# Patient Record
Sex: Male | Born: 1985 | Hispanic: No | Marital: Single | Smoking: Current every day smoker
Health system: Southern US, Community
[De-identification: ages and names within clinical notes are randomized; demographics above are authoritative.]

---

## 2010-11-04 ENCOUNTER — Inpatient Hospital Stay (INDEPENDENT_AMBULATORY_CARE_PROVIDER_SITE_OTHER)
Admission: RE | Admit: 2010-11-04 | Discharge: 2010-11-04 | Disposition: A | Discharge status: Home / Self Care | Payer: Self-pay | Source: Ambulatory Visit | Attending: Family Medicine | Admitting: Family Medicine

## 2010-11-04 ENCOUNTER — Ambulatory Visit (INDEPENDENT_AMBULATORY_CARE_PROVIDER_SITE_OTHER): Payer: Self-pay

## 2010-11-04 DIAGNOSIS — S20219A Contusion of unspecified front wall of thorax, initial encounter: Secondary | ICD-10-CM

## 2011-09-11 IMAGING — CR DG RIBS W/ CHEST 3+V*R*
4 series · 4 of 4 positions shown · non-contrast
Comparison: None.

CLINICAL DATA: Trauma with right rib pain.

RIGHT RIBS AND CHEST - 3+ VIEW

[view not recorded (1 of 4)]
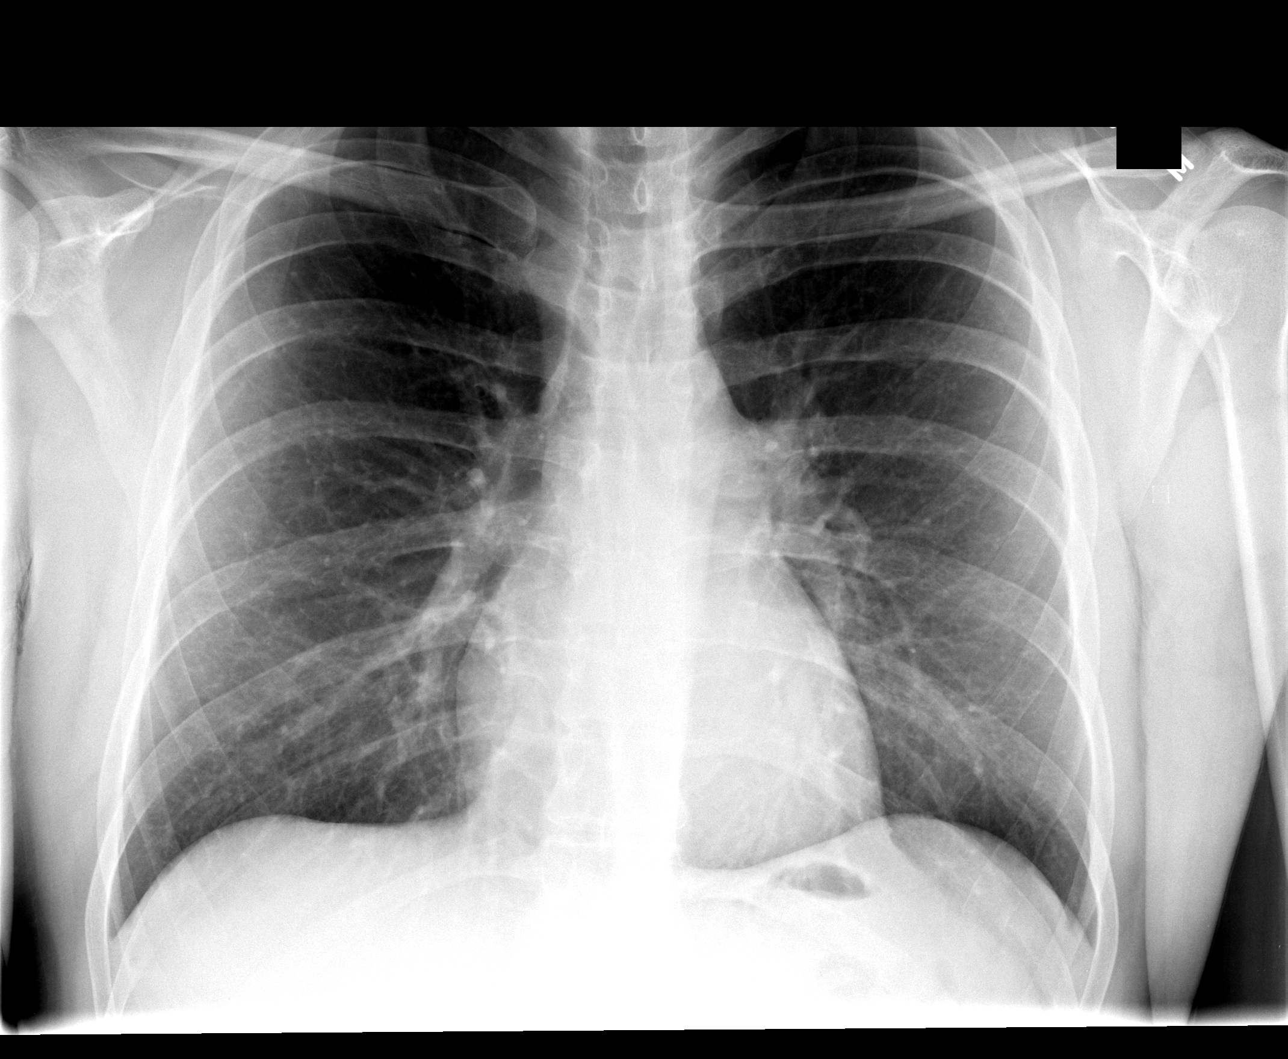

[view not recorded (2 of 4)]
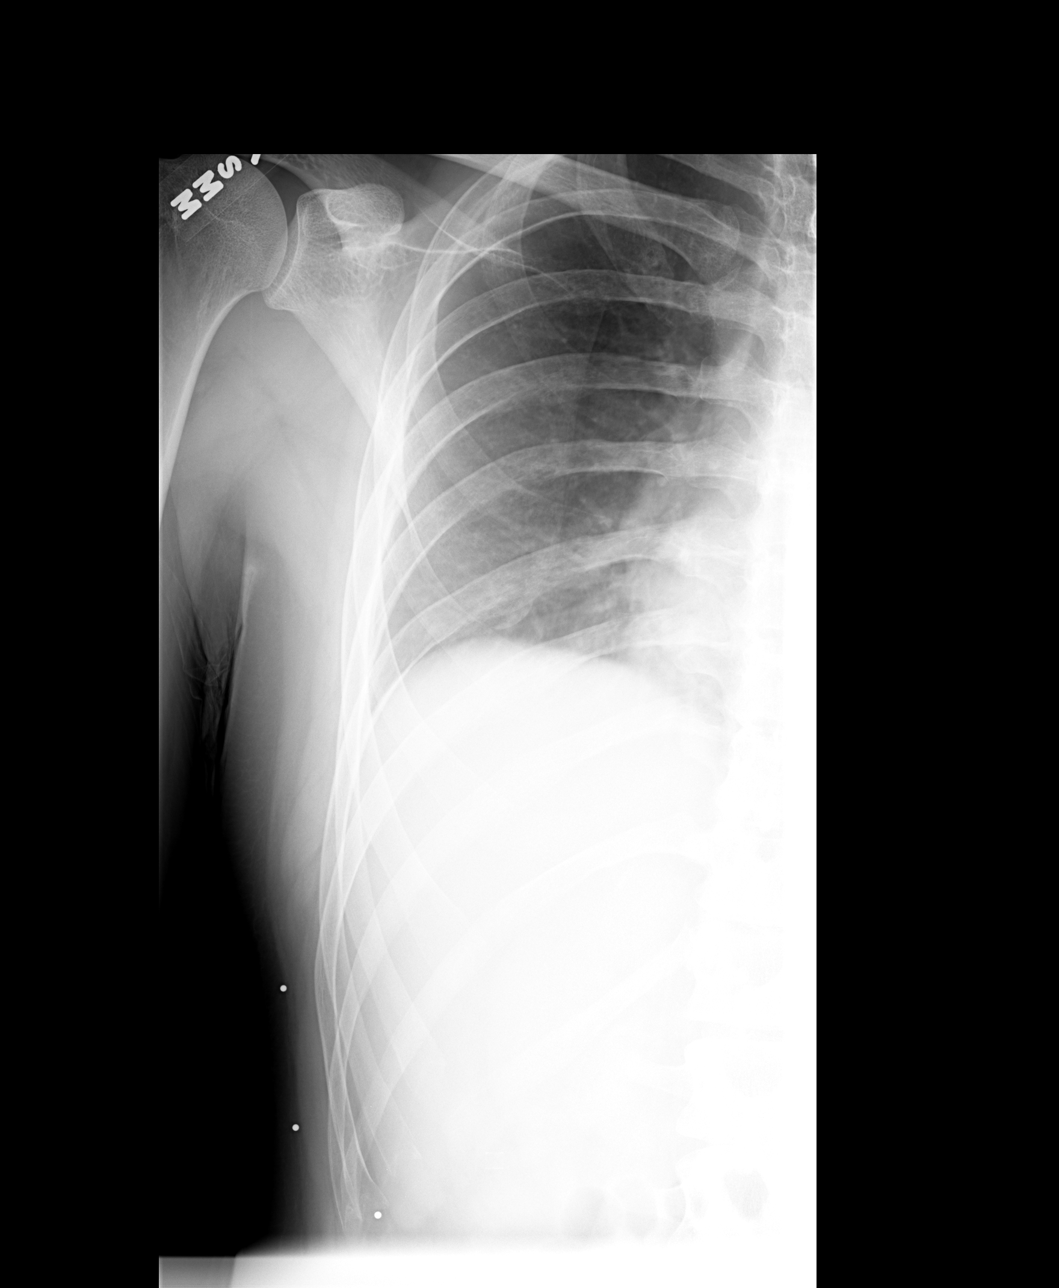

[view not recorded (3 of 4)]
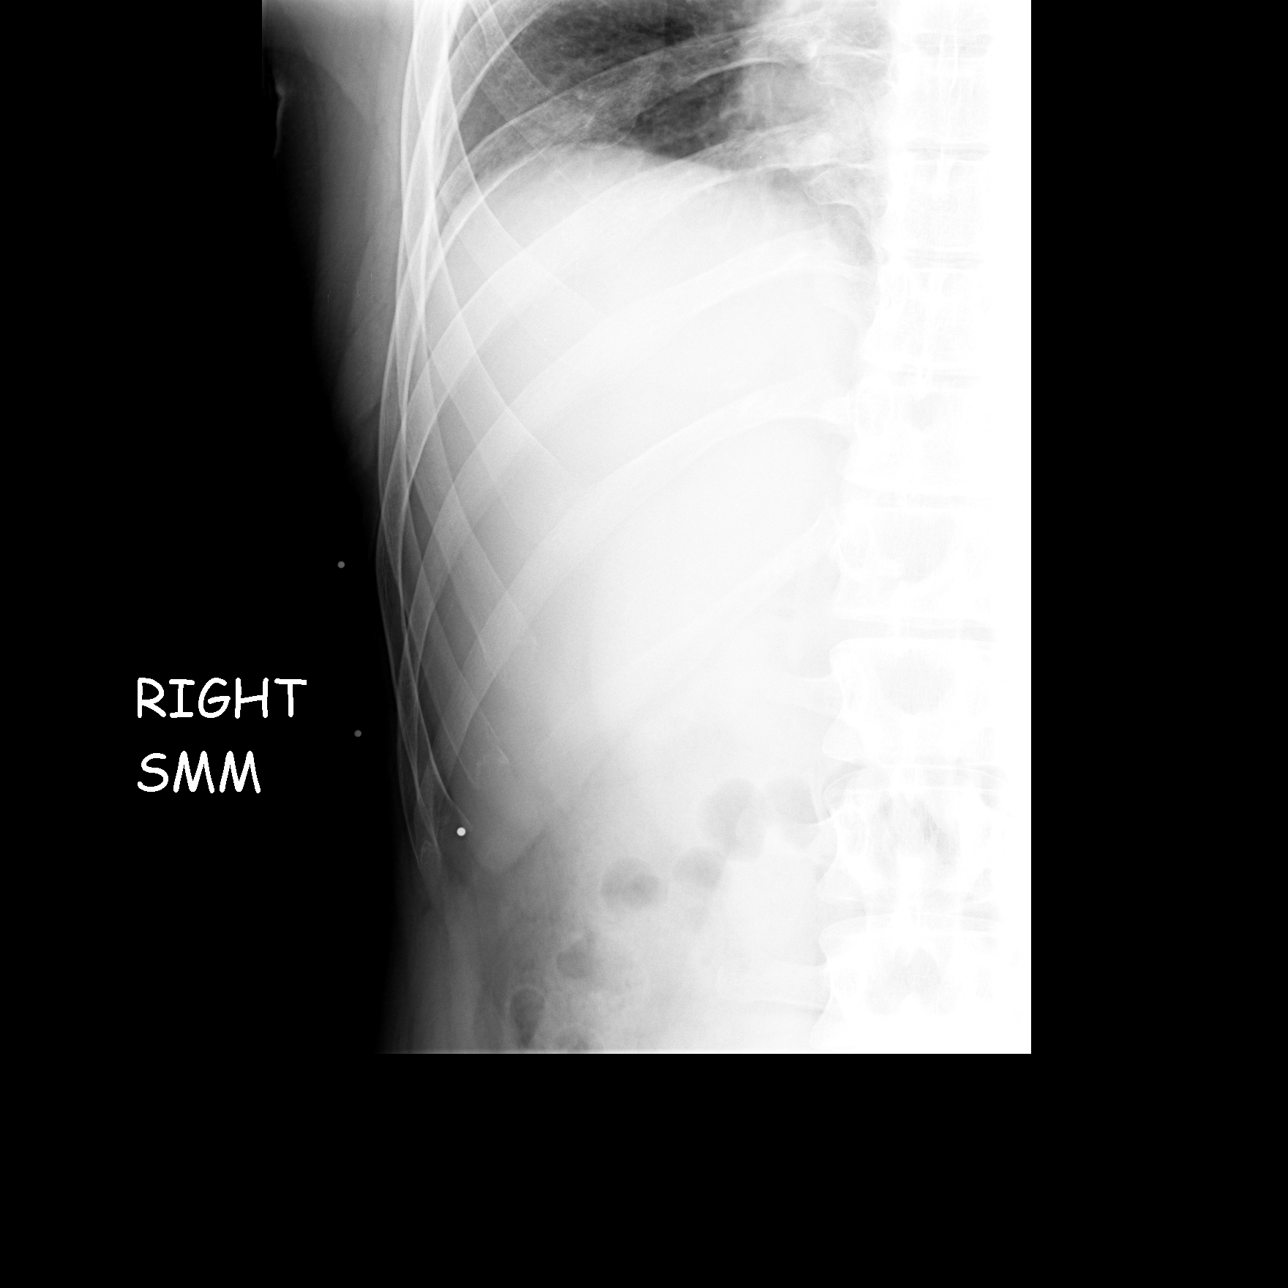

[view not recorded (4 of 4)]
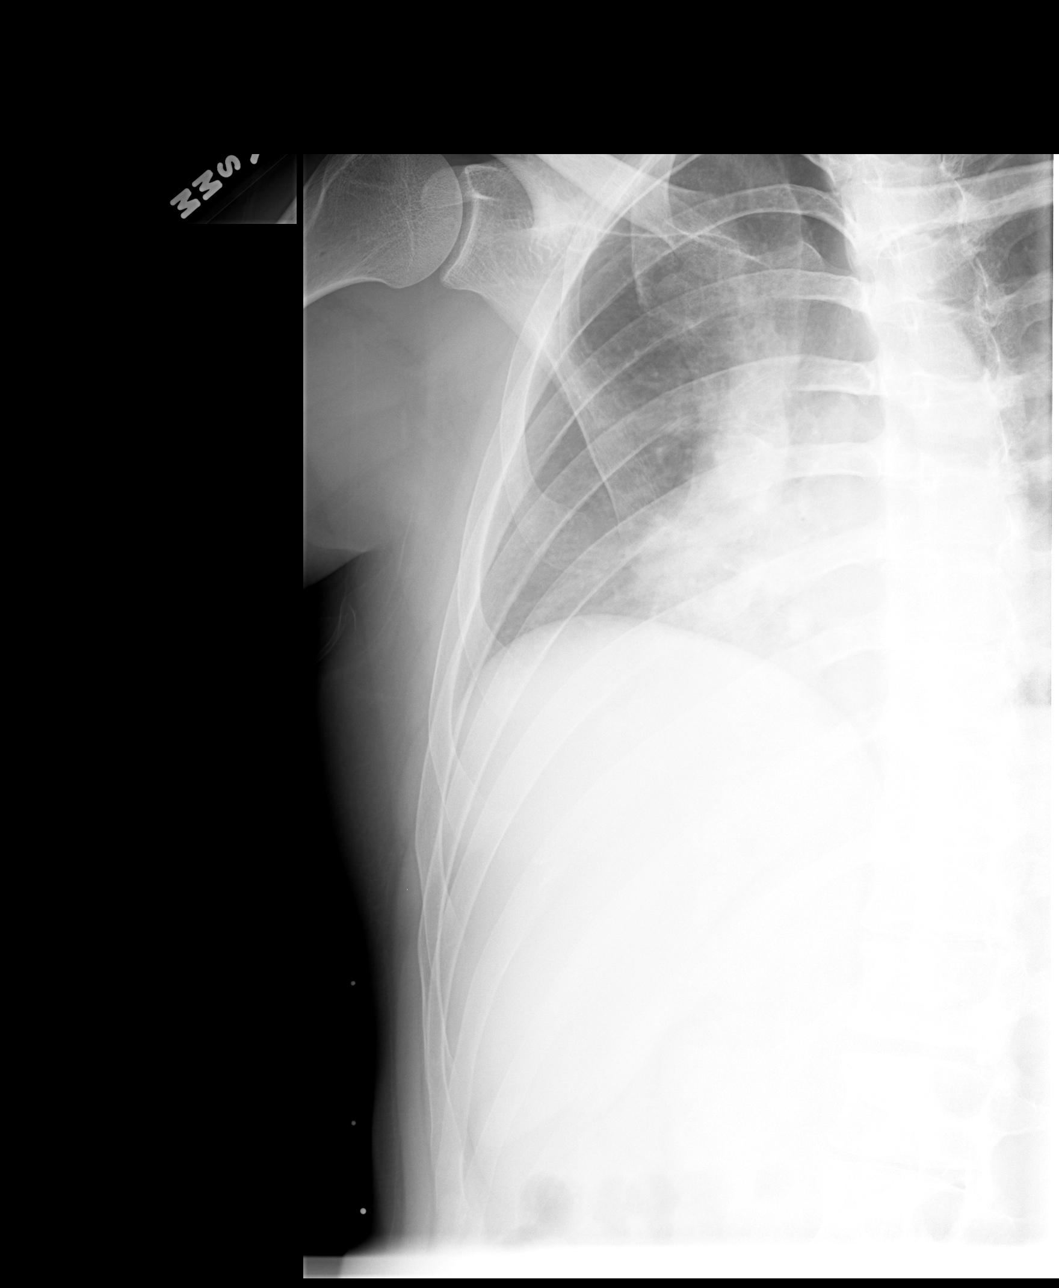

[4 of 4 positions shown; findings below may reference images not displayed]

FINDINGS: No right-sided rib fracture noted.  No pneumothorax.
Lungs clear.  Heart size within normal limits.
IMPRESSION: No right-sided rib fracture or pneumothorax detected.

## 2016-11-10 ENCOUNTER — Emergency Department (HOSPITAL_COMMUNITY)
Admission: EM | Admit: 2016-11-10 | Discharge: 2016-11-10 | Disposition: A | Discharge status: Home / Self Care | Payer: Managed Care, Other (non HMO) | Attending: Emergency Medicine | Admitting: Emergency Medicine

## 2016-11-10 ENCOUNTER — Emergency Department (HOSPITAL_COMMUNITY): Payer: Managed Care, Other (non HMO)

## 2016-11-10 ENCOUNTER — Encounter (HOSPITAL_COMMUNITY): Payer: Self-pay

## 2016-11-10 DIAGNOSIS — R0789 Other chest pain: Secondary | ICD-10-CM

## 2016-11-10 DIAGNOSIS — F17200 Nicotine dependence, unspecified, uncomplicated: Secondary | ICD-10-CM | POA: Diagnosis not present

## 2016-11-10 DIAGNOSIS — R079 Chest pain, unspecified: Secondary | ICD-10-CM | POA: Diagnosis present

## 2016-11-10 LAB — URINALYSIS, ROUTINE W REFLEX MICROSCOPIC
Bilirubin Urine: NEGATIVE
GLUCOSE, UA: NEGATIVE mg/dL
Hgb urine dipstick: NEGATIVE
Ketones, ur: 20 mg/dL — AB
LEUKOCYTES UA: NEGATIVE
Nitrite: NEGATIVE
PH: 6 (ref 5.0–8.0)
Protein, ur: NEGATIVE mg/dL
SPECIFIC GRAVITY, URINE: 1.005 (ref 1.005–1.030)

## 2016-11-10 LAB — BASIC METABOLIC PANEL
Anion gap: 11 (ref 5–15)
BUN: 12 mg/dL (ref 6–20)
CHLORIDE: 100 mmol/L — AB (ref 101–111)
CO2: 23 mmol/L (ref 22–32)
Calcium: 9.6 mg/dL (ref 8.9–10.3)
Creatinine, Ser: 0.89 mg/dL (ref 0.61–1.24)
GFR calc non Af Amer: >60 mL/min (ref >60)
Glucose, Bld: 84 mg/dL (ref 65–99)
POTASSIUM: 4.2 mmol/L (ref 3.5–5.1)
SODIUM: 134 mmol/L — AB (ref 135–145)

## 2016-11-10 LAB — I-STAT TROPONIN, ED: Troponin i, poc: 0 ng/mL (ref 0.00–0.08)

## 2016-11-10 LAB — CBC
HEMATOCRIT: 44.3 % (ref 39.0–52.0)
Hemoglobin: 14.9 g/dL (ref 13.0–17.0)
MCH: 31.3 pg (ref 26.0–34.0)
MCHC: 33.6 g/dL (ref 30.0–36.0)
MCV: 93.1 fL (ref 78.0–100.0)
Platelets: 260 10*3/uL (ref 150–400)
RBC: 4.76 MIL/uL (ref 4.22–5.81)
RDW: 12.5 % (ref 11.5–15.5)
WBC: 8.1 10*3/uL (ref 4.0–10.5)

## 2016-11-10 LAB — CBG MONITORING, ED: Glucose-Capillary: 75 mg/dL (ref 65–99)

## 2016-11-10 LAB — D-DIMER, QUANTITATIVE (NOT AT ARMC)

## 2016-11-10 NOTE — ED Triage Notes (Signed)
Pt states he has had lightheadedness and feeling not himself for several days. [facility] for tachycardia and further workup. Pt alert and oriented, skin warm and dry.

## 2016-11-10 NOTE — ED Provider Notes (Addendum)
[facility]-EMERGENCY DEPT Provider Note   CSN: [id number] Arrival date & time: 11/10/16  1452     History   Chief Complaint Chief Complaint  Patient presents with  . Fatigue    HPI Walter Figueroa is a 31 y.o. male.  Patient is a 31 year old male with a history of ADHD who presents with left-sided chest pain. He states it's been going on for about a week. It's to the anterior portion of his left shoulder and upper chest wall as well as the posterior shoulder area. It's sometimes radiates across his chest. There is no associated back pain other than the pain behind his left shoulder. He denies any radiation down his left arm. He denies any weakness or numbness in the hand. He states it's been fairly constant for about the last week but will get worse at times. He states that certain movements exacerbate the pain. When he was doing some lifting it seemed to get worse. It's nonexertional. He denies any shortness of breath associated with the pain. No nausea or vomiting. No prior history of heart disease. No known history of hyperlipidemia although he doesn't feel like he's had his cholesterol checked in the past. He is a smoker but he states he only smokes about 3-4 cigarettes a day. He states his dad did have bypass surgery in his early to mid 34s. He does state that he traveled to [place] about a month ago. He denies any pain or swelling in his lower extremities. He denies any pleuritic-type chest pain. He hasn't taken any medication for the pain. He was seen in urgent care and sent here for further evaluation.      History reviewed. No pertinent past medical history.  There are no active problems to display for this patient.   History reviewed. No pertinent surgical history.     Home Medications    Prior to Admission medications   Medication Sig Start Date End Date Taking? Authorizing Provider  amphetamine-dextroamphetamine (ADDERALL) 30 MG tablet Take 30 mg by mouth 2 (two)  times daily.   Yes [provider]  fluticasone (FLONASE) 50 MCG/ACT nasal spray Place 1 spray into both nostrils daily.   Yes [provider]  ibuprofen (ADVIL,MOTRIN) 200 MG tablet Take 200 mg by mouth every 6 (six) hours as needed for mild pain.   Yes [provider]  magnesium 30 MG tablet Take 30 mg by mouth 2 (two) times daily.   Yes [provider]  traZODone (DESYREL) 50 MG tablet Take 50 mg by mouth at bedtime as needed for sleep.   Yes [provider]    Family History History reviewed. No pertinent family history.  Social History Social History  Substance Use Topics  . Smoking status: Current Every Day Smoker    Packs/day: 0.25  . Smokeless tobacco: Never Used  . Alcohol use Yes     Comment: 5 days/week     Allergies   Patient has no known allergies.   Review of Systems Review of Systems  Constitutional: Negative for chills, diaphoresis, fatigue and fever.  HENT: Negative for congestion, rhinorrhea and sneezing.   Eyes: Negative.   Respiratory: Negative for cough, chest tightness and shortness of breath.   Cardiovascular: Positive for chest pain. Negative for leg swelling.  Gastrointestinal: Negative for abdominal pain, blood in stool, diarrhea, nausea and vomiting.  Genitourinary: Negative for difficulty urinating, flank pain, frequency and hematuria.  Musculoskeletal: Negative for arthralgias and back pain.  Skin: Negative for rash.  Neurological: Negative for dizziness, speech difficulty, weakness, numbness and headaches.     Physical Exam Updated Vital Signs BP (!) 143/91   Pulse 89   Temp 98.9 F (37.2 C) (Oral)   Resp 17   SpO2 100%   Physical Exam  Constitutional: He is oriented to person, place, and time. He appears well-developed and well-nourished.  HENT:  Head: Normocephalic and atraumatic.  Eyes: Pupils are equal, round, and reactive to light.  Neck: Normal range of motion. Neck supple.    Cardiovascular: Normal rate, regular rhythm and normal heart sounds.   Pulmonary/Chest: Effort normal and breath sounds normal. No respiratory distress. He has no wheezes. He has no rales. He exhibits no tenderness.  Mild reproducible tenderness to the left anterior chest wall and along the left trapezius muscle. There is no pain on palpation or range of motion of the shoulder. He has normal motor function sensation in the left arm. Radial pulses are intact. There is no swelling of the arm.  Abdominal: Soft. Bowel sounds are normal. There is no tenderness. There is no rebound and no guarding.  Musculoskeletal: Normal range of motion. He exhibits no edema.  No edema or calf tenderness  Lymphadenopathy:    He has no cervical adenopathy.  Neurological: He is alert and oriented to person, place, and time.  Skin: Skin is warm and dry. No rash noted.  Psychiatric: He has a normal mood and affect.     ED Treatments / Results  Labs (all labs ordered are listed, but only abnormal results are displayed) Labs Reviewed  BASIC METABOLIC PANEL - Abnormal; Notable for the following:       Result Value   Sodium 134 (*)    Chloride 100 (*)    All other components within normal limits  URINALYSIS, ROUTINE W REFLEX MICROSCOPIC - Abnormal; Notable for the following:    Color, Urine STRAW (*)    Ketones, ur 20 (*)    All other components within normal limits  CBC  D-DIMER, QUANTITATIVE (NOT AT [facility])  CBG MONITORING, ED  I-STAT TROPOININ, ED    EKG  EKG Interpretation  Date/Time:  Friday Nov 10 2016 14:59:13 EDT Ventricular Rate:  98 PR Interval:  142 QRS Duration: 86 QT Interval:  342 QTC Calculation: 436 R Axis:   91 Text Interpretation:  Normal sinus rhythm Possible Left atrial enlargement Rightward axis Borderline ECG No old tracing to compare Confirmed by Alaa Eyerman  MD, Aneta Hendershott ([id number]) on 11/10/2016 6:17:11 PM       Radiology Dg Chest 2 View  Result Date: 11/10/2016 CLINICAL DATA:   Chest pain radiating down the left arm for 1 week. EXAM: CHEST  2 VIEW COMPARISON:  None. FINDINGS: Normal heart size and mediastinal contours. No acute infiltrate or edema. No effusion or pneumothorax. No acute osseous findings. IMPRESSION: Negative chest. Electronically Signed   By: Marnee SpringJonathon  Watts M.D.   On: 11/10/2016 19:23    Procedures Procedures (including critical care time)  Medications Ordered in ED Medications - No data to display   Initial Impression / Assessment and Plan / ED Course  I have reviewed the triage vital signs and the nursing notes.  Pertinent labs & imaging results that were available during my care of the patient were reviewed by me and considered in my medical decision making (see chart for details).     Patient presents with left-sided chest and shoulder pain that it's been constant for about a week. It seems to be worse  with movement of the shoulder and lifting. It's mildly reproducible on palpation. His EKG doesn't show ischemic changes. His troponin is negative. I didn't feel that he needed a delta troponin given it's been constant for a week.  His d-dimer is normal and he has no other clinical suggestions of pulmonary emboli. He has a low HEART score of 2 and given this, feel that is appropriate to discharge patient and have him have close follow-up with his PCP. I did encourage him to try ibuprofen for symptomatic relief. Return precautions were given.  Final Clinical Impressions(s) / ED Diagnoses   Final diagnoses:  Atypical chest pain    New Prescriptions Discharge Medication List as of 11/10/2016  8:29 PM       Rolan Bucco, MD 11/10/16 2257    Rolan Bucco, MD 11/10/16 2257

## 2016-11-10 NOTE — ED Notes (Signed)
EDP at bedside  

## 2017-07-18 ENCOUNTER — Other Ambulatory Visit (HOSPITAL_COMMUNITY): Payer: Self-pay | Admitting: Family Medicine

## 2017-07-18 ENCOUNTER — Ambulatory Visit (HOSPITAL_COMMUNITY)
Admission: RE | Admit: 2017-07-18 | Discharge: 2017-07-18 | Disposition: A | Discharge status: Home / Self Care | Payer: Managed Care, Other (non HMO) | Source: Ambulatory Visit | Attending: Vascular Surgery | Admitting: Vascular Surgery

## 2017-07-18 DIAGNOSIS — R202 Paresthesia of skin: Secondary | ICD-10-CM | POA: Diagnosis not present

## 2017-07-18 DIAGNOSIS — R200 Anesthesia of skin: Secondary | ICD-10-CM

## 2017-07-19 ENCOUNTER — Ambulatory Visit (HOSPITAL_COMMUNITY)
Admission: RE | Admit: 2017-07-19 | Discharge: 2017-07-19 | Disposition: A | Discharge status: Home / Self Care | Payer: Managed Care, Other (non HMO) | Source: Ambulatory Visit | Attending: Vascular Surgery | Admitting: Vascular Surgery

## 2017-07-19 ENCOUNTER — Other Ambulatory Visit (HOSPITAL_COMMUNITY): Payer: Self-pay | Admitting: Family Medicine

## 2017-07-19 DIAGNOSIS — M79605 Pain in left leg: Secondary | ICD-10-CM

## 2017-07-19 DIAGNOSIS — R200 Anesthesia of skin: Secondary | ICD-10-CM | POA: Diagnosis not present

## 2017-09-17 IMAGING — DX DG CHEST 2V
2 series · 2 of 2 positions shown · non-contrast
Comparison: None.

CLINICAL DATA: Chest pain radiating down the left arm for 1 week.

EXAM:
CHEST  2 VIEW

[chest pa]
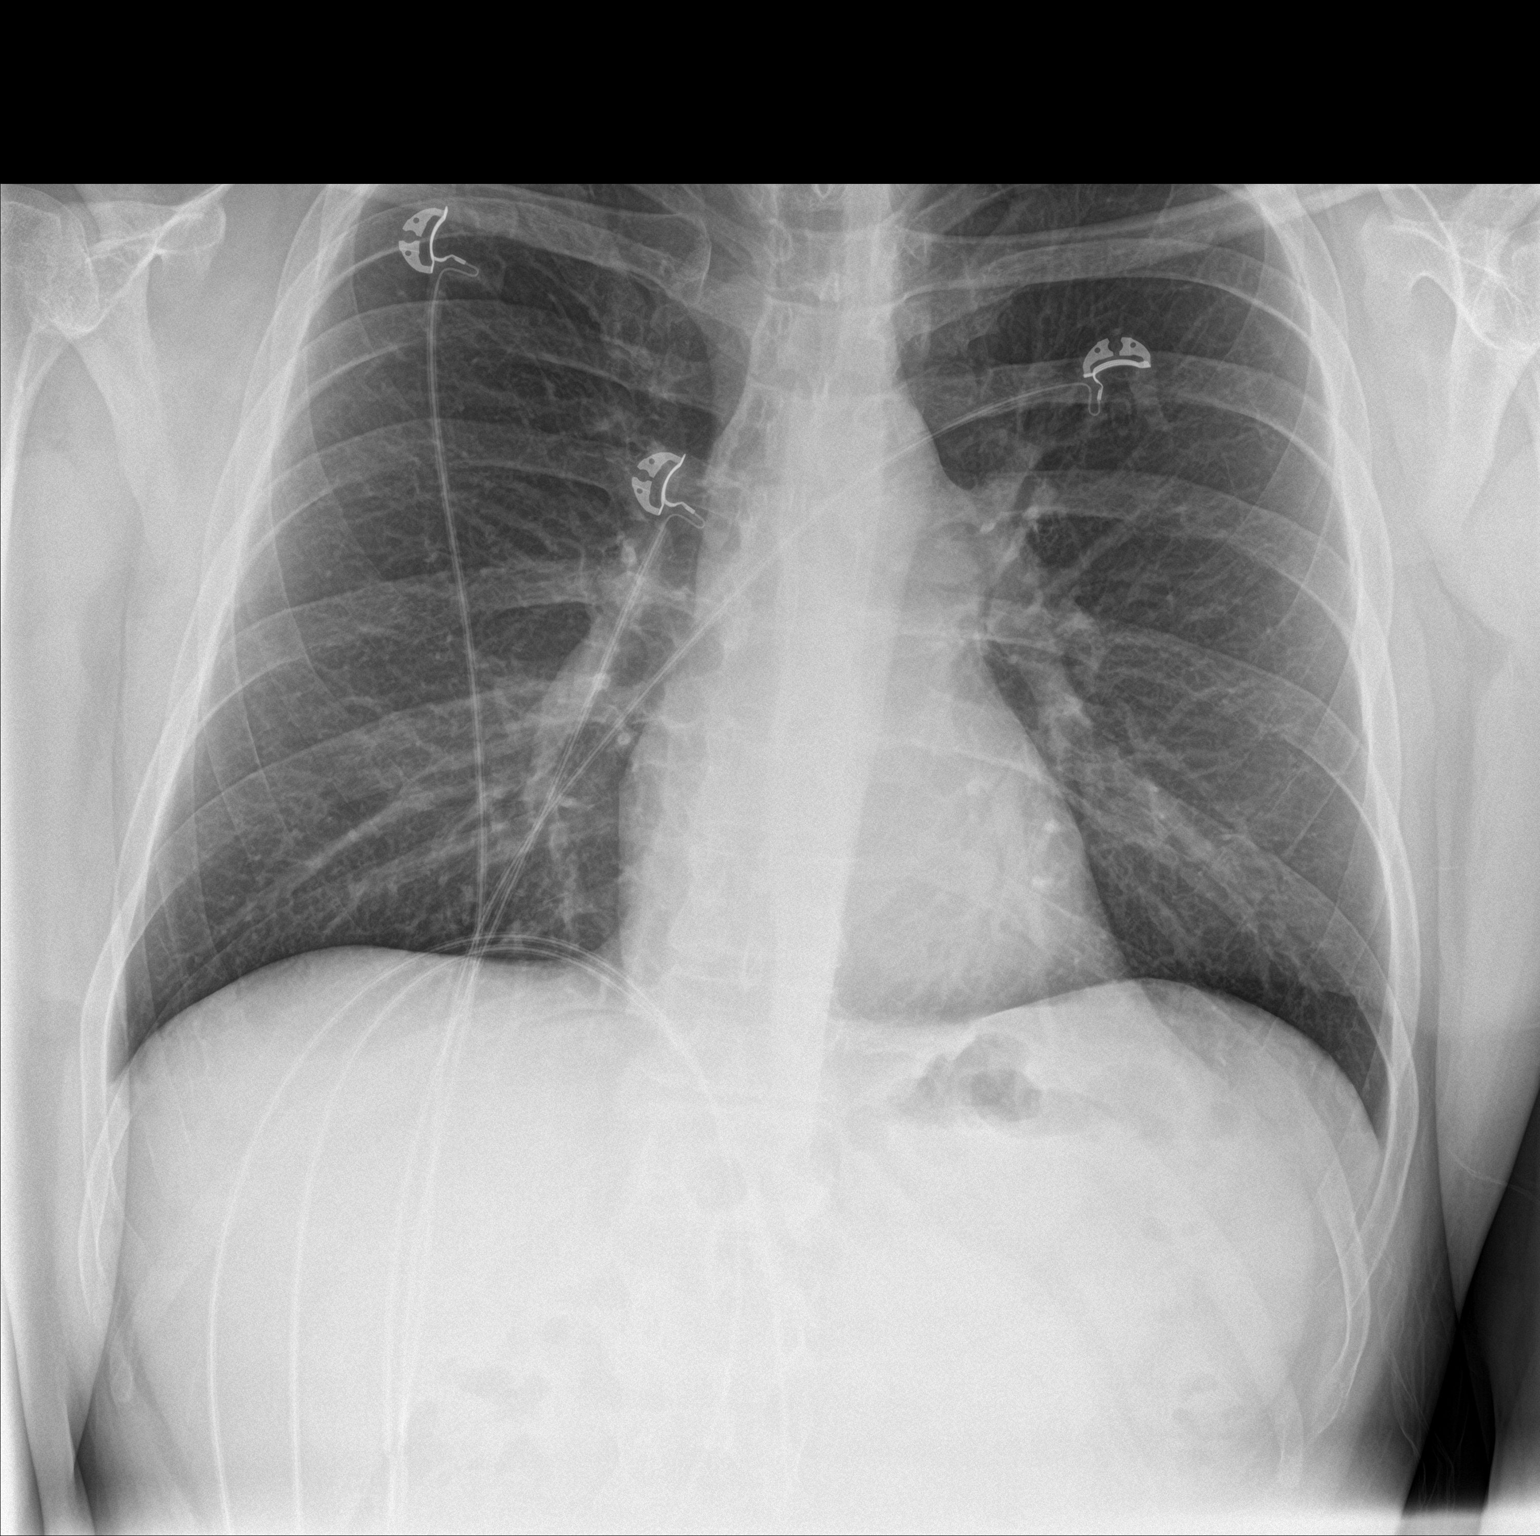

[chest lat]
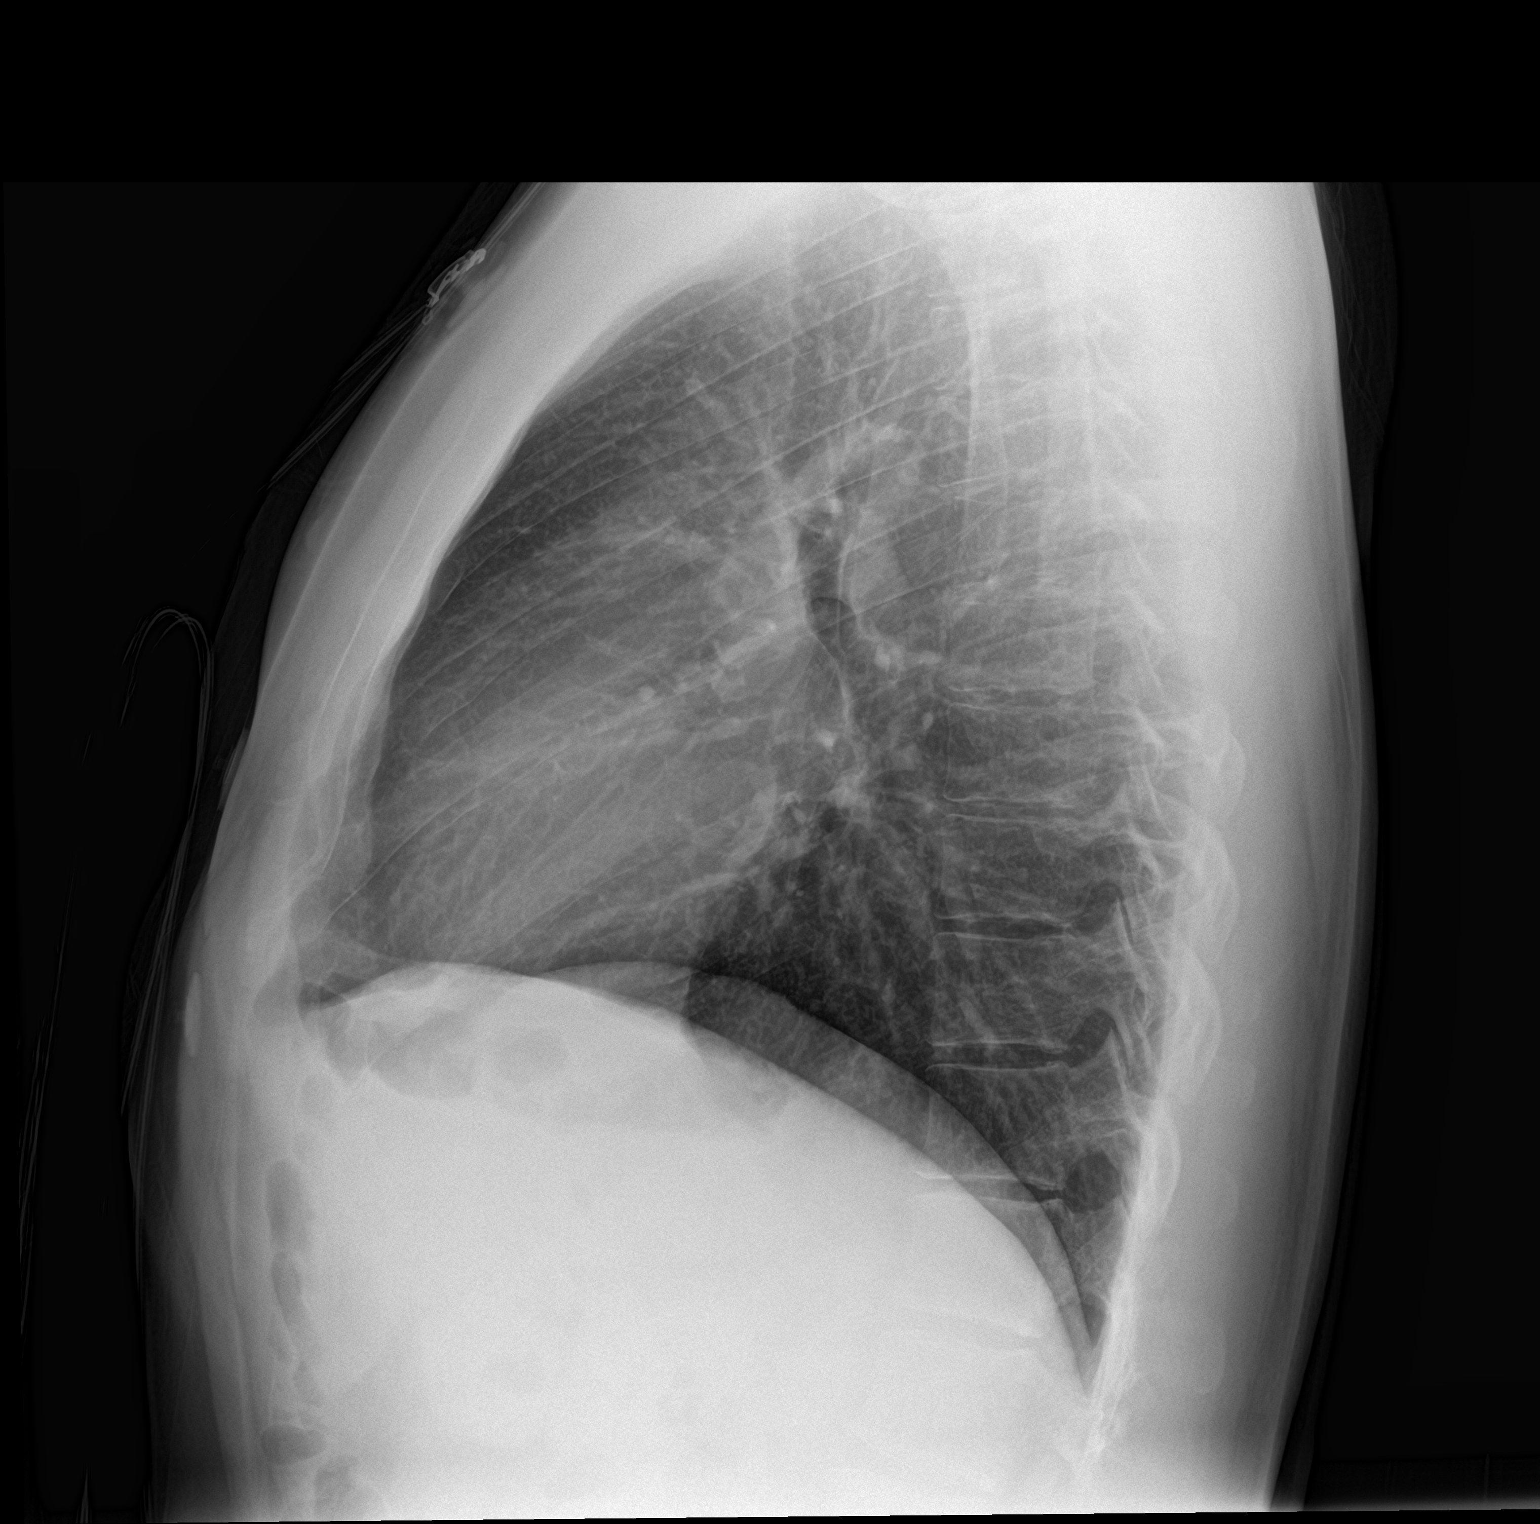

[2 of 2 positions shown; findings below may reference images not displayed]

FINDINGS: Normal heart size and mediastinal contours. No acute infiltrate or
edema. No effusion or pneumothorax. No acute osseous findings.
IMPRESSION: Negative chest.
